# Patient Record
Sex: Male | Born: 1990 | Race: White | Hispanic: No | Smoking: Never smoker
Health system: Southern US, Community
[De-identification: ages and names within clinical notes are randomized; demographics above are authoritative.]

---

## 2016-10-09 ENCOUNTER — Emergency Department: Payer: Worker's Compensation

## 2016-10-09 ENCOUNTER — Emergency Department
Admission: EM | Admit: 2016-10-09 | Discharge: 2016-10-09 | Disposition: A | Discharge status: Home / Self Care | Payer: Worker's Compensation | Attending: Emergency Medicine | Admitting: Emergency Medicine

## 2016-10-09 DIAGNOSIS — S93402A Sprain of unspecified ligament of left ankle, initial encounter: Secondary | ICD-10-CM | POA: Insufficient documentation

## 2016-10-09 DIAGNOSIS — Y9389 Activity, other specified: Secondary | ICD-10-CM | POA: Diagnosis not present

## 2016-10-09 DIAGNOSIS — W010XXA Fall on same level from slipping, tripping and stumbling without subsequent striking against object, initial encounter: Secondary | ICD-10-CM | POA: Insufficient documentation

## 2016-10-09 DIAGNOSIS — Y929 Unspecified place or not applicable: Secondary | ICD-10-CM | POA: Diagnosis not present

## 2016-10-09 DIAGNOSIS — S99912A Unspecified injury of left ankle, initial encounter: Secondary | ICD-10-CM | POA: Diagnosis present

## 2016-10-09 DIAGNOSIS — Y990 Civilian activity done for income or pay: Secondary | ICD-10-CM | POA: Diagnosis not present

## 2016-10-09 NOTE — ED Notes (Signed)
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 He lost his balance and rolled his left foot.

## 2016-10-09 NOTE — Discharge Instructions (Signed)
Your exam and x-ray are consistent with a Grade II ankle sprain without fracture or dislocation. Wear the ankle stirrup when out of bed. Rest with the foot elevated and apply ice to reduce swelling. Take OTC ibuprofen for swelling. Follow-up with [facility] for re-evaluation. Perform ankle ROM exercises as demonstrated.

## 2016-10-09 NOTE — ED Triage Notes (Signed)
Pt arrives to ED via POV with c/o LEFT ankle pain s/p slip and fall at work 3 hrs PTA. Pt reports "twisiting the ankle at work" and will be filed as W/C. No obvious deformity or dislocation; moderate swelling noted, CMS intact. Pt is A&O, in NAD; RR even, regular, and unlabored; skin color/temp is WNL.

## 2016-10-09 NOTE — ED Provider Notes (Signed)
[facility] Emergency Department Provider Note ____________________________________________  Time seen: 1934  I have reviewed the triage vital signs and the nursing notes.  HISTORY  Chief Complaint  Ankle Pain  HPI William Castaneda is a 26 y.o. male presents to the ED from his workplace for complaints of injury sustained while at work. 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 He reports falling as he lost his balance. He presents now with left ankle pain & swelling, but denies any other injury at this time.  History reviewed. No pertinent past medical history.  There are no active problems to display for this patient.  History reviewed. No pertinent surgical history.  Prior to Admission medications   Not on File    Allergies Patient has no known allergies.  No family history on file.  Social History Social History  Substance Use Topics  . Smoking status: Never Smoker  . Smokeless tobacco: Never Used  . Alcohol use Yes    Review of Systems  Constitutional: Negative for fever. Cardiovascular: Negative for chest pain. Respiratory: Negative for shortness of breath. Musculoskeletal: Negative for back pain. Left ankle pain as above Skin: Negative for rash. Neurological: Negative for headaches, focal weakness or numbness. ____________________________________________  PHYSICAL EXAM:  VITAL SIGNS: ED Triage Vitals  Enc Vitals Group     BP 10/09/16 1932 126/77     Pulse Rate 10/09/16 1932 75     Resp 10/09/16 1932 18     Temp 10/09/16 1932 98.5 F (36.9 C)     Temp Source 10/09/16 1932 Oral     SpO2 10/09/16 1932 98 %     Weight 10/09/16 1927 173 lb (78.5 kg)     Height 10/09/16 1927  (1.93 m)     Head Circumference --      Peak Flow --      Pain Score 10/09/16 1926 0     Pain Loc --      Pain Edu? --      Excl. in GC? --     Constitutional: Alert and oriented. Well appearing and in no  distress. Head: Normocephalic and atraumatic. Cardiovascular: Normal distal pulses. Respiratory: Normal respiratory effort.  Musculoskeletal: Left foot and ankle with moderate lateral soft tissue swelling about the malleolus. Patient with normal ankle range of motion in all planes. Negative anterior posterior drawer. No significant calf or Achilles tenderness is appreciated. Nontender with normal range of motion in all extremities.  Neurologic:  Antalgic gait without ataxia. Normal speech and language. No gross focal neurologic deficits are appreciated. Skin:  Skin is warm, dry and intact. No rash noted. ____________________________________________   RADIOLOGY  Left Ankle IMPRESSION: Prominent soft tissue swelling over lateral malleolus and moderate tibiotalar joint effusion. No acute osseous abnormality.  I, Fauna Neuner, Charlesetta Ivory, personally viewed and evaluated these images (plain radiographs) as part of my medical decision making, as well as reviewing the written report by the radiologist. ____________________________________________  PROCEDURES  Ankle stirrup splint ____________________________________________  INITIAL IMPRESSION / ASSESSMENT AND PLAN / ED COURSE  Patient was ED evaluation of injury sustained while at work today. He actually rolled his left ankle, sustaining a grade 2 ankle sprain. No radiological evidence of fracture or dislocation. He is placed in a Velcro splint for comfort and will dose ibuprofen for inflammation. RICE instructions along with range of motion exercises are provided. He will return to work on his next scheduled shift which is Saturday.  Follow-up with [facility] for ongoing symptom management. ____________________________________________  FINAL CLINICAL IMPRESSION(S) / ED DIAGNOSES  Final diagnoses:  Sprain of left ankle, unspecified ligament, initial encounter      Lissa Hoard, PA-C 10/09/16 2036    Lissa Hoard,  PA-C 10/09/16 2037    Don Perking [place], MD 10/17/16 1540

## 2018-05-02 IMAGING — DX DG ANKLE COMPLETE 3+V*L*
3 series · 3 of 3 positions shown · non-contrast
Comparison: None.

CLINICAL DATA: Left ankle pain after twisting injury.

EXAM:
LEFT ANKLE COMPLETE - 3+ VIEW

[ankle ap]
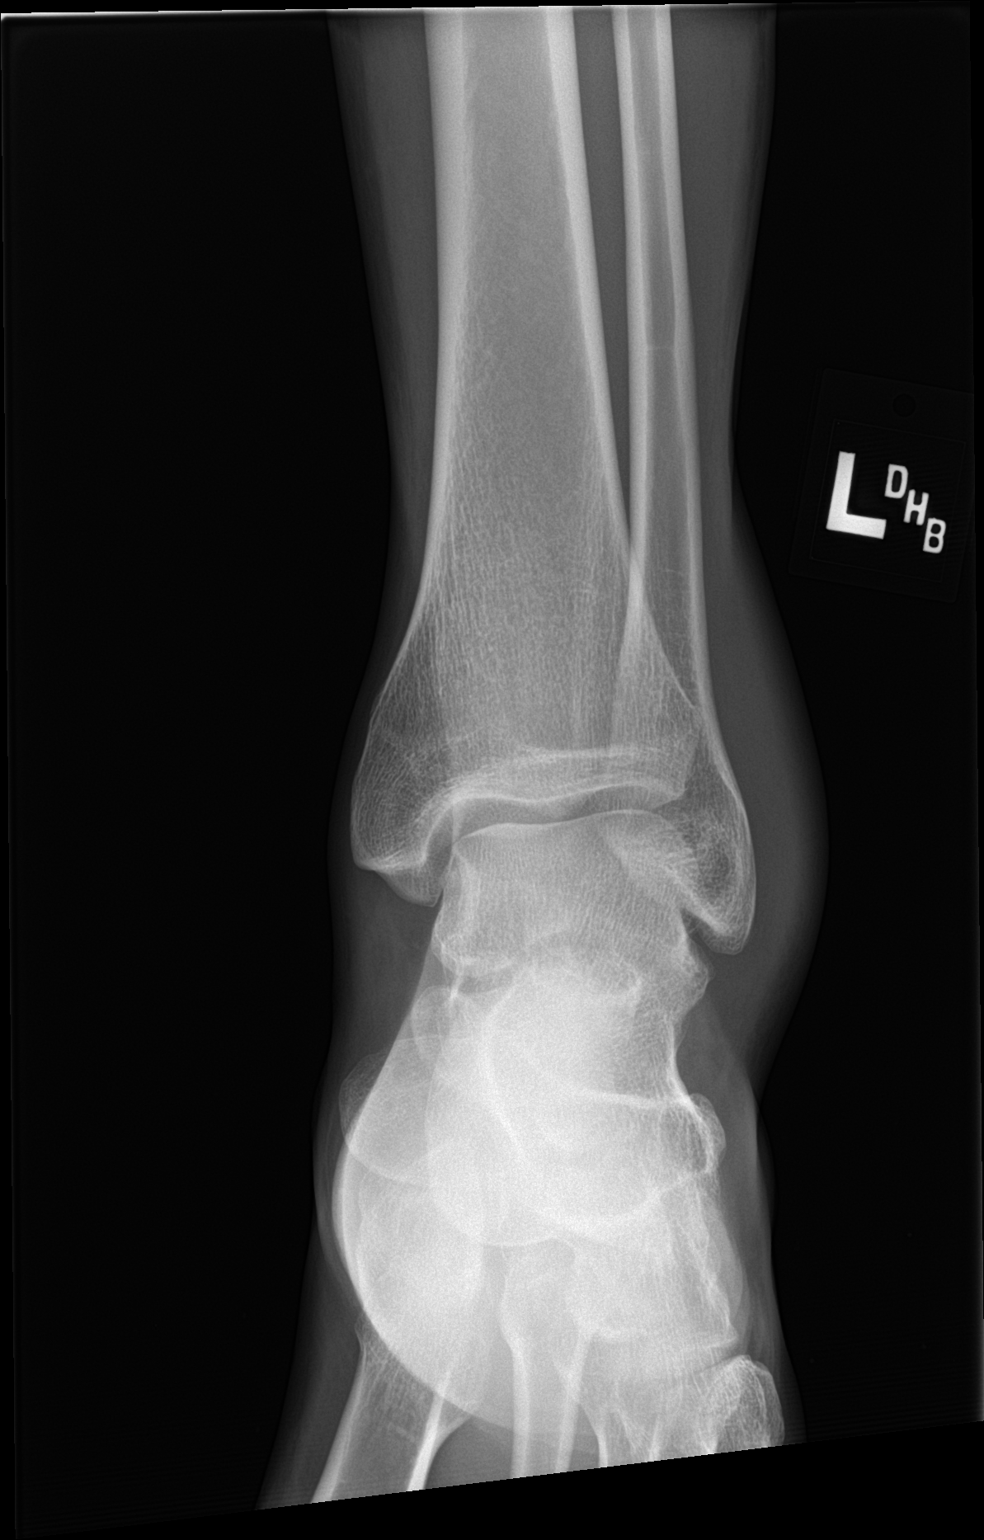

[ankle obl]
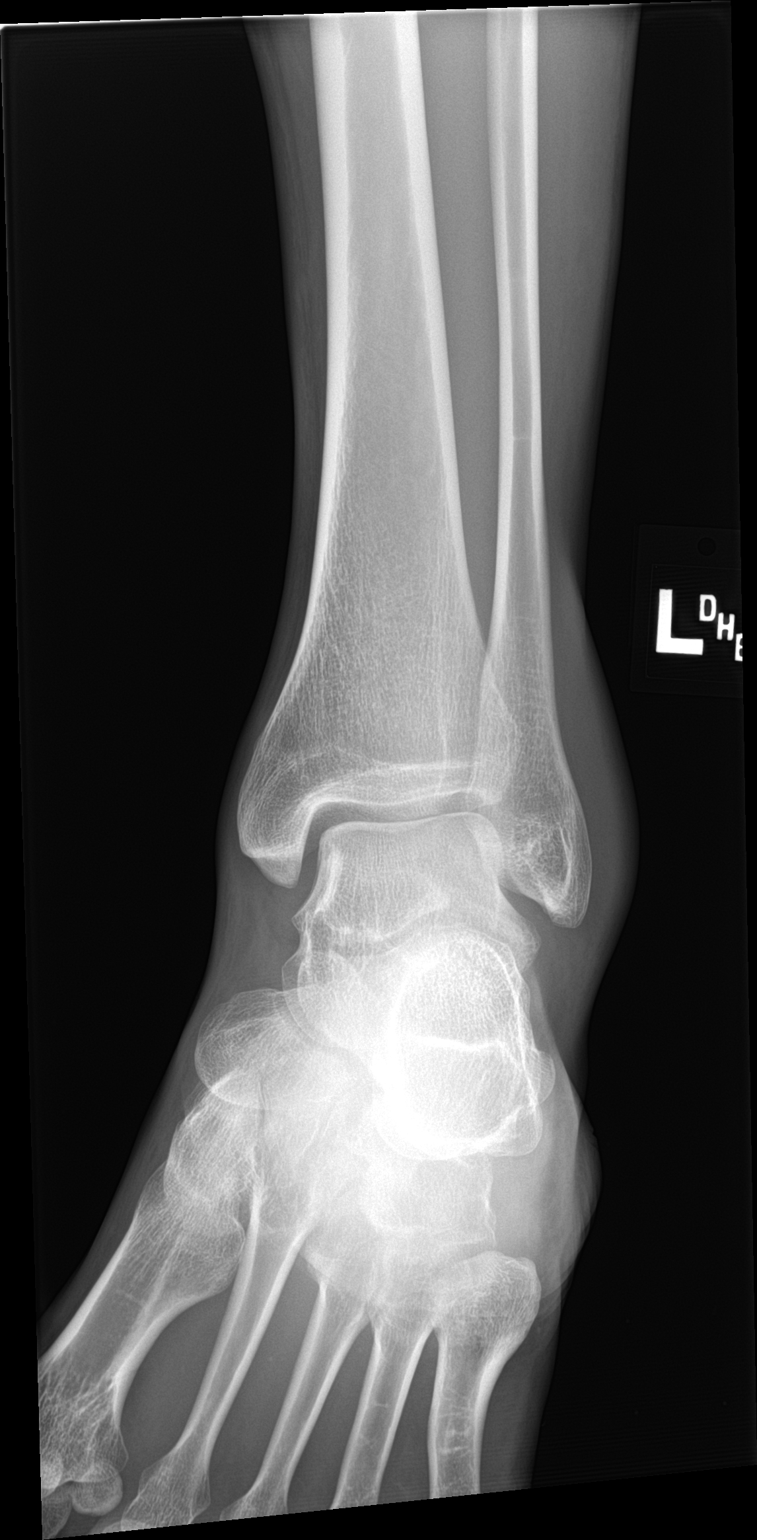

[ankle lat]
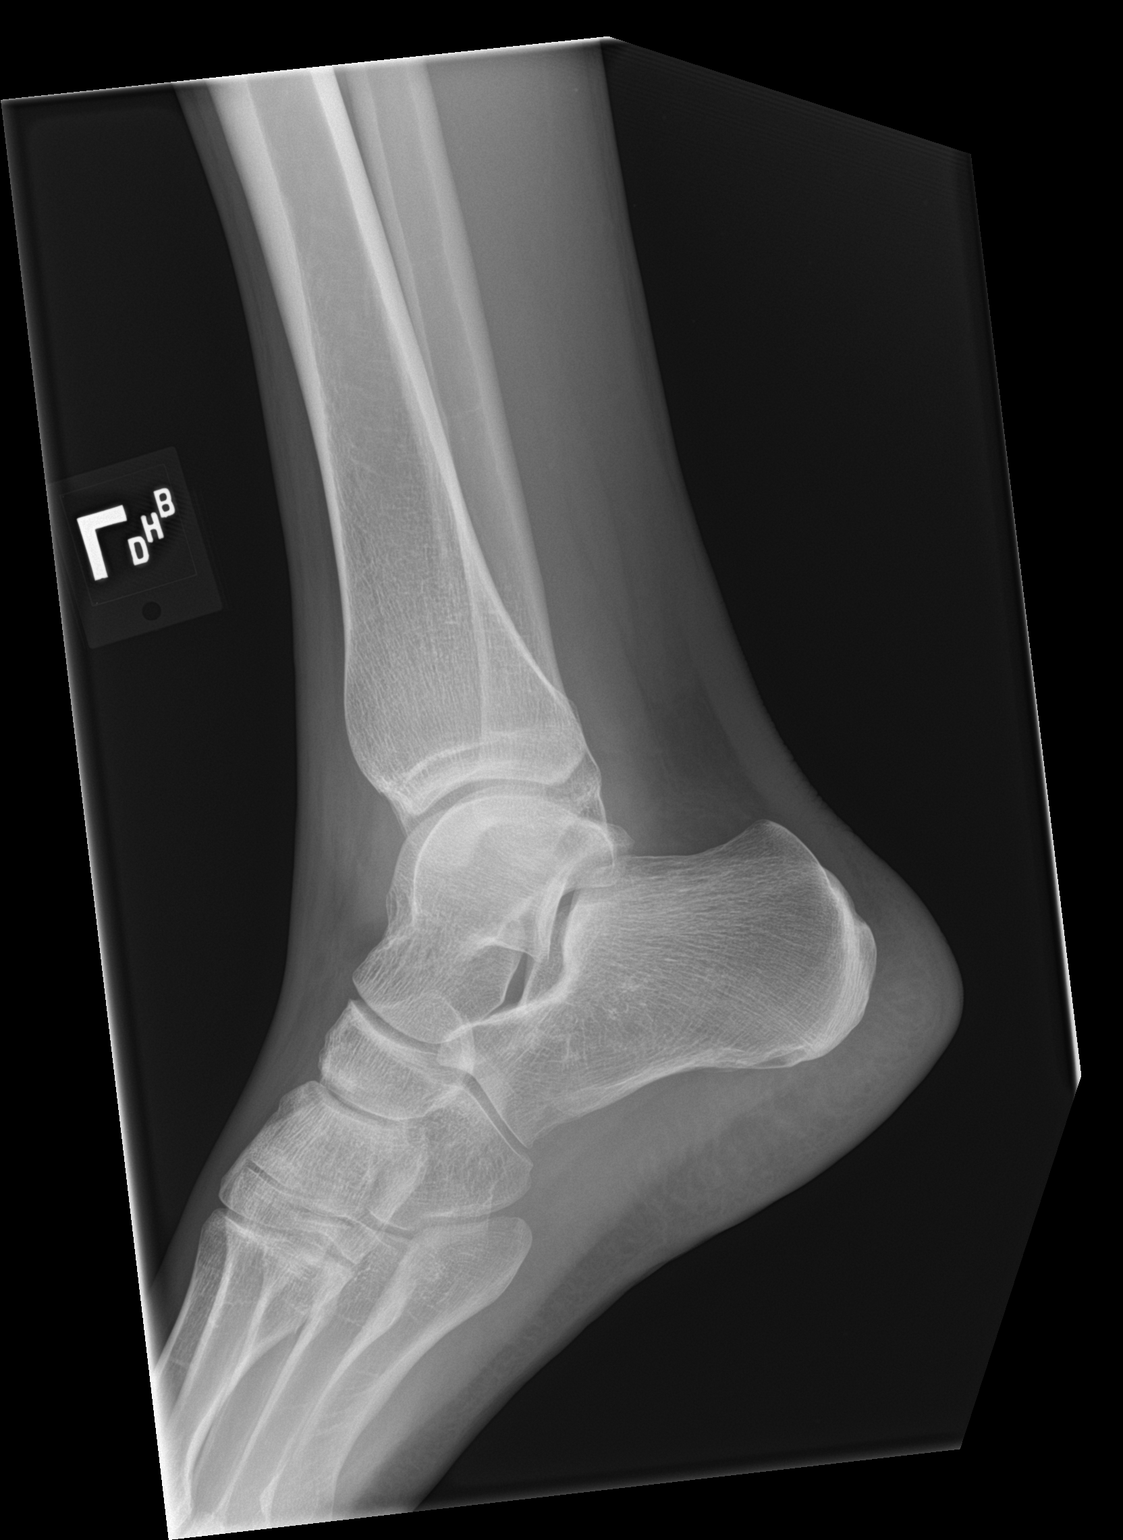

[3 of 3 positions shown; findings below may reference images not displayed]

FINDINGS: No acute fracture or malalignment. The talar dome is intact. The
ankle mortise is symmetric. Prominent soft tissue swelling over the
lateral malleolus. Moderate tibiotalar joint effusion. Bone
mineralization is normal.
IMPRESSION: Prominent soft tissue swelling over lateral malleolus and moderate
tibiotalar joint effusion. No acute osseous abnormality.
